# Patient Record
Sex: Male | Born: 1965 | Race: White | Hispanic: No | Marital: Married | Smoking: Former smoker
Health system: Southern US, Community
[De-identification: ages and names within clinical notes are randomized; demographics above are authoritative.]

## PROBLEM LIST (undated history)

## (undated) DIAGNOSIS — I10 Essential (primary) hypertension: Secondary | ICD-10-CM

## (undated) DIAGNOSIS — T7840XA Allergy, unspecified, initial encounter: Secondary | ICD-10-CM

## (undated) DIAGNOSIS — F1910 Other psychoactive substance abuse, uncomplicated: Secondary | ICD-10-CM

## (undated) DIAGNOSIS — E785 Hyperlipidemia, unspecified: Secondary | ICD-10-CM

## (undated) HISTORY — DX: Allergy, unspecified, initial encounter: T78.40XA

## (undated) HISTORY — DX: Other psychoactive substance abuse, uncomplicated: F19.10

## (undated) HISTORY — DX: Hyperlipidemia, unspecified: E78.5

## (undated) HISTORY — DX: Essential (primary) hypertension: I10

---

## 1998-08-23 HISTORY — PX: CERVICAL FUSION: SHX112

## 2015-01-13 ENCOUNTER — Telehealth: Payer: Self-pay | Admitting: Family Medicine

## 2015-01-14 NOTE — Telephone Encounter (Signed)
Stp he ntbs to establish care, pt doesn't have any insurance, he hasn't went to a doctor for a complete physical since the 80's when he was in the [employer]. Pt thinks he has gout , pt aware to arrive 30 minutes prior.

## 2015-01-14 NOTE — Telephone Encounter (Signed)
Pt given appt with Dr.Stacks 6/28 at 1:10.

## 2015-02-18 ENCOUNTER — Encounter: Payer: Self-pay | Admitting: Family Medicine

## 2015-02-18 ENCOUNTER — Ambulatory Visit: Payer: Self-pay | Admitting: Family Medicine

## 2015-02-18 ENCOUNTER — Encounter (INDEPENDENT_AMBULATORY_CARE_PROVIDER_SITE_OTHER): Payer: Self-pay

## 2015-02-18 VITALS — BP 146/92 | HR 76 | Temp 97.7°F | Ht 66.5 in | Wt 210.8 lb

## 2015-02-18 DIAGNOSIS — R6882 Decreased libido: Secondary | ICD-10-CM

## 2015-02-18 DIAGNOSIS — R072 Precordial pain: Secondary | ICD-10-CM

## 2015-02-18 DIAGNOSIS — M10079 Idiopathic gout, unspecified ankle and foot: Secondary | ICD-10-CM

## 2015-02-18 DIAGNOSIS — R12 Heartburn: Secondary | ICD-10-CM

## 2015-02-18 DIAGNOSIS — Z1212 Encounter for screening for malignant neoplasm of rectum: Secondary | ICD-10-CM

## 2015-02-18 LAB — POCT UA - MICROSCOPIC ONLY
Bacteria, U Microscopic: NEGATIVE
CRYSTALS, UR, HPF, POC: NEGATIVE
Casts, Ur, LPF, POC: NEGATIVE
Yeast, UA: NEGATIVE

## 2015-02-18 LAB — POCT URINALYSIS DIPSTICK
Bilirubin, UA: NEGATIVE
Glucose, UA: NEGATIVE
KETONES UA: NEGATIVE
Leukocytes, UA: NEGATIVE
NITRITE UA: NEGATIVE
SPEC GRAV UA: 1.01
Urobilinogen, UA: NEGATIVE
pH, UA: 6.5

## 2015-02-18 NOTE — Patient Instructions (Signed)
DASH Eating Plan DASH stands for "Dietary Approaches to Stop Hypertension." The DASH eating plan is a healthy eating plan that has been shown to reduce high blood pressure (hypertension). Additional health benefits may include reducing the risk of type 2 diabetes mellitus, heart disease, and stroke. The DASH eating plan may also help with weight loss. WHAT DO I NEED TO KNOW ABOUT THE DASH EATING PLAN? For the DASH eating plan, you will follow these general guidelines:  Choose foods with a percent daily value for sodium of less than 5% (as listed on the food label).  Use salt-free seasonings or herbs instead of table salt or sea salt.  Check with your health care provider or pharmacist before using salt substitutes.  Eat lower-sodium products, often labeled as "lower sodium" or "no salt added."  Eat fresh foods.  Eat more vegetables, fruits, and low-fat dairy products.  Choose whole grains. Look for the word "whole" as the first word in the ingredient list.  Choose fish and skinless chicken or turkey more often than red meat. Limit fish, poultry, and meat to 6 oz (170 g) each day.  Limit sweets, desserts, sugars, and sugary drinks.  Choose heart-healthy fats.  Limit cheese to 1 oz (28 g) per day.  Eat more home-cooked food and less restaurant, buffet, and fast food.  Limit fried foods.  Cook foods using methods other than frying.  Limit canned vegetables. If you do use them, rinse them well to decrease the sodium.  When eating at a restaurant, ask that your food be prepared with less salt, or no salt if possible. WHAT FOODS CAN I EAT? Seek help from a dietitian for individual calorie needs. Grains Whole grain or whole wheat bread. Brown rice. Whole grain or whole wheat pasta. Quinoa, bulgur, and whole grain cereals. Low-sodium cereals. Corn or whole wheat flour tortillas. Whole grain cornbread. Whole grain crackers. Low-sodium crackers. Vegetables Fresh or frozen vegetables  (raw, steamed, roasted, or grilled). Low-sodium or reduced-sodium tomato and vegetable juices. Low-sodium or reduced-sodium tomato sauce and paste. Low-sodium or reduced-sodium canned vegetables.  Fruits All fresh, canned (in natural juice), or frozen fruits. Meat and Other Protein Products Ground beef (85% or leaner), grass-fed beef, or beef trimmed of fat. Skinless chicken or turkey. Ground chicken or turkey. Pork trimmed of fat. All fish and seafood. Eggs. Dried beans, peas, or lentils. Unsalted nuts and seeds. Unsalted canned beans. Dairy Low-fat dairy products, such as skim or 1% milk, 2% or reduced-fat cheeses, low-fat ricotta or cottage cheese, or plain low-fat yogurt. Low-sodium or reduced-sodium cheeses. Fats and Oils Tub margarines without trans fats. Light or reduced-fat mayonnaise and salad dressings (reduced sodium). Avocado. Safflower, olive, or canola oils. Natural peanut or almond butter. Other Unsalted popcorn and pretzels. The items listed above may not be a complete list of recommended foods or beverages. Contact your dietitian for more options. WHAT FOODS ARE NOT RECOMMENDED? Grains White bread. White pasta. White rice. Refined cornbread. Bagels and croissants. Crackers that contain trans fat. Vegetables Creamed or fried vegetables. Vegetables in a cheese sauce. Regular canned vegetables. Regular canned tomato sauce and paste. Regular tomato and vegetable juices. Fruits Dried fruits. Canned fruit in light or heavy syrup. Fruit juice. Meat and Other Protein Products Fatty cuts of meat. Ribs, chicken wings, bacon, sausage, bologna, salami, chitterlings, fatback, hot dogs, bratwurst, and packaged luncheon meats. Salted nuts and seeds. Canned beans with salt. Dairy Whole or 2% milk, cream, half-and-half, and cream cheese. Whole-fat or sweetened yogurt. Full-fat   cheeses or blue cheese. Nondairy creamers and whipped toppings. Processed cheese, cheese spreads, or cheese  curds. Condiments Onion and garlic salt, seasoned salt, table salt, and sea salt. Canned and packaged gravies. Worcestershire sauce. Tartar sauce. Barbecue sauce. Teriyaki sauce. Soy sauce, including reduced sodium. Steak sauce. Fish sauce. Oyster sauce. Cocktail sauce. Horseradish. Ketchup and mustard. Meat flavorings and tenderizers. Bouillon cubes. Hot sauce. Tabasco sauce. Marinades. Taco seasonings. Relishes. Fats and Oils Butter, stick margarine, lard, shortening, ghee, and bacon fat. Coconut, palm kernel, or palm oils. Regular salad dressings. Other Pickles and olives. Salted popcorn and pretzels. The items listed above may not be a complete list of foods and beverages to avoid. Contact your dietitian for more information. WHERE CAN I FIND MORE INFORMATION? National Heart, [employer], and [employer] Institute: www.nhlbi.nih.gov/health/health-topics/topics/dash/ Document Released: 07/29/2011 Document Revised: 12/24/2013 Document Reviewed: 06/13/2013 ExitCare Patient Information 2015 [employer]. This information is not intended to replace advice given to you by your health care provider. Make sure you discuss any questions you have with your health care provider. Calorie Counting for Weight Loss Calories are energy you get from the things you eat and drink. Your body uses this energy to keep you going throughout the day. The number of calories you eat affects your weight. When you eat more calories than your body needs, your body stores the extra calories as fat. When you eat fewer calories than your body needs, your body burns fat to get the energy it needs. Calorie counting means keeping track of how many calories you eat and drink each day. If you make sure to eat fewer calories than your body needs, you should lose weight. In order for calorie counting to work, you will need to eat the number of calories that are right for you in a day to lose a healthy amount of weight per week. A healthy amount of  weight to lose per week is usually 1-2 lb (0.5-0.9 kg). A dietitian can determine how many calories you need in a day and give you suggestions on how to reach your calorie goal.  WHAT IS MY MY PLAN? My goal is to have __________ calories per day.  If I have this many calories per day, I should lose around __________ pounds per week. WHAT DO I NEED TO KNOW ABOUT CALORIE COUNTING? In order to meet your daily calorie goal, you will need to:  Find out how many calories are in each food you would like to eat. Try to do this before you eat.  Decide how much of the food you can eat.  Write down what you ate and how many calories it had. Doing this is called keeping a food log. WHERE DO I FIND CALORIE INFORMATION? The number of calories in a food can be found on a Nutrition Facts label. Note that all the information on a label is based on a specific serving of the food. If a food does not have a Nutrition Facts label, try to look up the calories online or ask your dietitian for help. HOW DO I DECIDE HOW MUCH TO EAT? To decide how much of the food you can eat, you will need to consider both the number of calories in one serving and the size of one serving. This information can be found on the Nutrition Facts label. If a food does not have a Nutrition Facts label, look up the information online or ask your dietitian for help. Remember that calories are listed per serving. If you   choose to have more than one serving of a food, you will have to multiply the calories per serving by the amount of servings you plan to eat. For example, the label on a package of bread might say that a serving size is 1 slice and that there are 90 calories in a serving. If you eat 1 slice, you will have eaten 90 calories. If you eat 2 slices, you will have eaten 180 calories. HOW DO I KEEP A FOOD LOG? After each meal, record the following information in your food log:  What you ate.  How much of it you ate.  How many calories  it had.  Then, add up your calories. Keep your food log near you, such as in a small notebook in your pocket. Another option is to use a mobile app or website. Some programs will calculate calories for you and show you how many calories you have left each time you add an item to the log. WHAT ARE SOME CALORIE COUNTING TIPS?  Use your calories on foods and drinks that will fill you up and not leave you hungry. Some examples of this include foods like nuts and nut butters, vegetables, lean proteins, and high-fiber foods (more than 5 g fiber per serving).  Eat nutritious foods and avoid empty calories. Empty calories are calories you get from foods or beverages that do not have many nutrients, such as candy and soda. It is better to have a nutritious high-calorie food (such as an avocado) than a food with few nutrients (such as a bag of chips).  Know how many calories are in the foods you eat most often. This way, you do not have to look up how many calories they have each time you eat them.  Look out for foods that may seem like low-calorie foods but are really high-calorie foods, such as baked goods, soda, and fat-free candy.  Pay attention to calories in drinks. Drinks such as sodas, specialty coffee drinks, alcohol, and juices have a lot of calories yet do not fill you up. Choose low-calorie drinks like water and diet drinks.  Focus your calorie counting efforts on higher calorie items. Logging the calories in a garden salad that contains only vegetables is less important than calculating the calories in a milk shake.  Find a way of tracking calories that works for you. Get creative. Most people who are successful find ways to keep track of how much they eat in a day, even if they do not count every calorie. WHAT ARE SOME PORTION CONTROL TIPS?  Know how many calories are in a serving. This will help you know how many servings of a certain food you can have.  Use a measuring cup to measure  serving sizes. This is helpful when you start out. With time, you will be able to estimate serving sizes for some foods.  Take some time to put servings of different foods on your favorite plates, bowls, and cups so you know what a serving looks like.  Try not to eat straight from a bag or box. Doing this can lead to overeating. Put the amount you would like to eat in a cup or on a plate to make sure you are eating the right portion.  Use smaller plates, glasses, and bowls to prevent overeating. This is a quick and easy way to practice portion control. If your plate is smaller, less food can fit on it.  Try not to multitask while eating, such   as watching TV or using your computer. If it is time to eat, sit down at a table and enjoy your food. Doing this will help you to start recognizing when you are full. It will also make you more aware of what and how much you are eating. HOW CAN I CALORIE COUNT WHEN EATING OUT?  Ask for smaller portion sizes or child-sized portions.  Consider sharing an entree and sides instead of getting your own entree.  If you get your own entree, eat only half. Ask for a box at the beginning of your meal and put the rest of your entree in it so you are not tempted to eat it.  Look for the calories on the menu. If calories are listed, choose the lower calorie options.  Choose dishes that include vegetables, fruits, whole grains, low-fat dairy products, and lean protein. Focusing on smart food choices from each of the 5 food groups can help you stay on track at restaurants.  Choose items that are boiled, broiled, grilled, or steamed.  Choose water, milk, unsweetened iced tea, or other drinks without added sugars. If you want an alcoholic beverage, choose a lower calorie option. For example, a regular margarita can have up to 700 calories and a glass of wine has around 150.  Stay away from items that are buttered, battered, fried, or served with cream sauce. Items  labeled "crispy" are usually fried, unless stated otherwise.  Ask for dressings, sauces, and syrups on the side. These are usually very high in calories, so do not eat much of them.  Watch out for salads. Many people think salads are a healthy option, but this is often not the case. Many salads come with bacon, fried chicken, lots of cheese, fried chips, and dressing. All of these items have a lot of calories. If you want a salad, choose a garden salad and ask for grilled meats or steak. Ask for the dressing on the side, or ask for olive oil and vinegar or lemon to use as dressing.  Estimate how many servings of a food you are given. For example, a serving of cooked rice is  cup or about the size of half a tennis ball or one cupcake wrapper. Knowing serving sizes will help you be aware of how much food you are eating at restaurants. The list below tells you how big or small some common portion sizes are based on everyday objects.  1 oz--4 stacked dice.  3 oz--1 deck of cards.  1 tsp--1 dice.  1 Tbsp-- a Ping-Pong ball.  2 Tbsp--1 Ping-Pong ball.   cup--1 tennis ball or 1 cupcake wrapper.  1 cup--1 baseball. Document Released: 08/09/2005 Document Revised: 12/24/2013 Document Reviewed: 06/14/2013 ExitCare Patient Information 2015 [employer]. This information is not intended to replace advice given to you by your health care provider. Make sure you discuss any questions you have with your health care provider.  

## 2015-02-18 NOTE — Progress Notes (Signed)
Subjective:  Patient ID: Lawrence Sharp, male    DOB: 29-Aug-1965  Age: 49 y.o. MRN: [id number]  CC: Establish Care   HPI LUI BELLIS presents for BP runs 139/70 checked daily at work. Not sure why it was so high here today. Patient has no insurance and wants to review his risks for heart disease and stroke due to a friend having a heart attack 2-3 weeks ago. Patient realizes he needs to exercise. He has not exercised in over 10 years. He admits to not eating a balanced or healthy diet. He is not interested in taking medications. He asks for the possibility for natural treatments for cholesterol etc.  Patient would like to have Viagra for his loss of libido. He is curious about his testosterone level. However, he states that he cannot afford the blood test. 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 These can be rather heavy to lift and move along with the equipment he uses for the job.   HE experiences heartburn with spicy foods. It is moderate and may last several hours. He has been belching frequently.  History Jamauri has a past medical history of shellfish; Hypertension; Hyperlipidemia; and Substance abuse.   He has past surgical history that includes Cervical fusion (2000).   His family history includes Heart disease in his father; Hypertension in his father. There is no history of Asthma, Cancer, COPD, Early death, or Mental illness.He reports that he has quit smoking. He does not have any smokeless tobacco history on file. He reports that he drinks alcohol. He reports that he uses illicit drugs (Marijuana).  No current outpatient prescriptions on file prior to visit.   No current facility-administered medications on file prior to visit.    ROS Review of Systems  Constitutional: Negative for fever, chills, diaphoresis, activity change, appetite change, fatigue and unexpected weight change.  HENT: Negative for congestion, ear pain, hearing loss, postnasal drip,  rhinorrhea, sore throat, tinnitus and trouble swallowing.   Eyes: Negative for photophobia, pain, discharge and redness.  Respiratory: Positive for chest tightness. Negative for apnea, cough, choking, shortness of breath, wheezing and stridor.   Cardiovascular: Positive for chest pain. Negative for palpitations and leg swelling.  Gastrointestinal: Negative for nausea, vomiting, abdominal pain, diarrhea, constipation, blood in stool and abdominal distention.  Endocrine: Negative for cold intolerance, heat intolerance, polydipsia, polyphagia and polyuria.  Genitourinary: Negative for dysuria, urgency, frequency, hematuria, flank pain, enuresis, difficulty urinating and genital sores.  Musculoskeletal: Negative for joint swelling and arthralgias.  Skin: Negative for color change, rash and wound.  Allergic/Immunologic: Negative for immunocompromised state.  Neurological: Negative for dizziness, tremors, seizures, syncope, facial asymmetry, speech difficulty, weakness, light-headedness, numbness and headaches.  Hematological: Does not bruise/bleed easily.  Psychiatric/Behavioral: Negative for suicidal ideas, hallucinations, behavioral problems, confusion, sleep disturbance, dysphoric mood, decreased concentration and agitation. The patient is not nervous/anxious and is not hyperactive.     Objective:  BP 146/92 mmHg  Pulse 76  Temp(Src) 97.7 F (36.5 C) (Oral)  Ht 5' 6.5" (1.689 m)  Wt 210 lb 12.8 oz (95.618 kg)  BMI 33.52 kg/m2  Physical Exam  Constitutional: He is oriented to person, place, and time. He appears well-developed and well-nourished. No distress.  HENT:  Head: Normocephalic and atraumatic.  Right Ear: External ear normal.  Left Ear: External ear normal.  Nose: Nose normal.  Mouth/Throat: Oropharynx is clear and moist.  Eyes: Conjunctivae and EOM are normal. Pupils are equal, round, and  reactive to light.  Neck: Normal range of motion. Neck supple. No thyromegaly present.    Cardiovascular: Normal rate, regular rhythm and normal heart sounds.   No murmur heard. Pulmonary/Chest: Effort normal and breath sounds normal. No respiratory distress. He has no wheezes. He has no rales.  Abdominal: Soft. Bowel sounds are normal. He exhibits no distension. There is no tenderness.  Lymphadenopathy:    He has no cervical adenopathy.  Neurological: He is alert and oriented to person, place, and time. He has normal reflexes.  Skin: Skin is warm and dry.  Psychiatric: He has a normal mood and affect. His behavior is normal. Judgment and thought content normal.    Assessment & Plan:   Bertis was seen today for establish care.  Diagnoses and all orders for this visit:  Precordial pain Orders: -     POCT CBC -     CMP14+EGFR -     Lipid panel -     PSA, total and free -     POCT UA - Microscopic Only -     POCT urinalysis dipstick  Heartburn Orders: -     POCT CBC -     CMP14+EGFR -     Lipid panel -     PSA, total and free -     POCT UA - Microscopic Only -     POCT urinalysis dipstick  Loss of libido Orders: -     POCT CBC -     CMP14+EGFR -     Lipid panel -     PSA, total and free -     POCT UA - Microscopic Only -     POCT urinalysis dipstick  Idiopathic gout of foot, unspecified chronicity, unspecified laterality Orders: -     POCT CBC -     CMP14+EGFR -     Lipid panel -     PSA, total and free -     POCT UA - Microscopic Only -     POCT urinalysis dipstick  Screening for malignant neoplasm of the rectum Orders: -     Fecal occult blood, imunochemical   Mr. Dunlevy does not currently have medications on file.  No orders of the defined types were placed in this encounter.   Discussed Krill oil for cholesterol. DASH plan for BP. Calorie reduction handout given for recommended weight loss of 40 lb. Pt. Declines stress test at this time.  Follow-up: Return in about 3 months (around 05/21/2015), or if symptoms worsen or fail to  improve.  Claretta Fraise, M.D.

## 2015-02-20 LAB — FECAL OCCULT BLOOD, IMMUNOCHEMICAL: FECAL OCCULT BLD: NEGATIVE

## 2015-02-22 ENCOUNTER — Other Ambulatory Visit: Payer: Self-pay | Admitting: Nurse Practitioner

## 2015-02-22 ENCOUNTER — Other Ambulatory Visit (INDEPENDENT_AMBULATORY_CARE_PROVIDER_SITE_OTHER): Payer: Self-pay

## 2015-02-22 DIAGNOSIS — Z0000 Encounter for general adult medical examination without abnormal findings: Secondary | ICD-10-CM

## 2015-02-22 LAB — POCT CBC
GRANULOCYTE PERCENT: 68.8 % (ref 37–80)
HCT, POC: 42.8 % — AB (ref 43.5–53.7)
Hemoglobin: 13.5 g/dL — AB (ref 14.1–18.1)
LYMPH, POC: 2 (ref 0.6–3.4)
MCH: 27.4 pg (ref 27–31.2)
MCHC: 31.6 g/dL — AB (ref 31.8–35.4)
MCV: 86.6 fL (ref 80–97)
MPV: 7.4 fL (ref 0–99.8)
PLATELET COUNT, POC: 278 10*3/uL (ref 142–424)
POC Granulocyte: 5.2 (ref 2–6.9)
POC LYMPH %: 26.1 % (ref 10–50)
RBC: 4.95 M/uL (ref 4.69–6.13)
RDW, POC: 13.9 %
WBC: 7.6 10*3/uL (ref 4.6–10.2)

## 2015-02-24 LAB — CMP14+EGFR
A/G RATIO: 1.7 (ref 1.1–2.5)
ALBUMIN: 4.3 g/dL (ref 3.5–5.5)
ALT: 23 IU/L (ref 0–44)
AST: 16 IU/L (ref 0–40)
Alkaline Phosphatase: 41 IU/L (ref 39–117)
BUN/Creatinine Ratio: 20 (ref 9–20)
BUN: 16 mg/dL (ref 6–24)
Bilirubin Total: 0.5 mg/dL (ref 0.0–1.2)
CO2: 22 mmol/L (ref 18–29)
Calcium: 9.1 mg/dL (ref 8.7–10.2)
Chloride: 97 mmol/L (ref 97–108)
Creatinine, Ser: 0.82 mg/dL (ref 0.76–1.27)
GFR calc Af Amer: 119 mL/min/{1.73_m2} (ref >59)
GFR, EST NON AFRICAN AMERICAN: 103 mL/min/{1.73_m2} (ref >59)
GLOBULIN, TOTAL: 2.6 g/dL (ref 1.5–4.5)
Glucose: 106 mg/dL — ABNORMAL HIGH (ref 65–99)
Potassium: 4.3 mmol/L (ref 3.5–5.2)
Sodium: 140 mmol/L (ref 134–144)
Total Protein: 6.9 g/dL (ref 6.0–8.5)

## 2015-02-24 LAB — PSA TOTAL+% FREE (SERIAL)
PSA FREE: 0.19 ng/mL
PSA, Free Pct: 38 %
Prostate Specific Ag, Serum: 0.5 ng/mL (ref 0.0–4.0)

## 2015-02-24 LAB — LIPID PANEL
Chol/HDL Ratio: 5 ratio units (ref 0.0–5.0)
Cholesterol, Total: 234 mg/dL — ABNORMAL HIGH (ref 100–199)
HDL: 47 mg/dL (ref >39)
LDL Calculated: 145 mg/dL — ABNORMAL HIGH (ref 0–99)
Triglycerides: 212 mg/dL — ABNORMAL HIGH (ref 0–149)
VLDL CHOLESTEROL CAL: 42 mg/dL — AB (ref 5–40)

## 2016-04-21 ENCOUNTER — Emergency Department (HOSPITAL_COMMUNITY)
Admission: EM | Admit: 2016-04-21 | Discharge: 2016-04-21 | Disposition: A | Discharge status: Home / Self Care | Payer: Self-pay | Attending: Emergency Medicine | Admitting: Emergency Medicine

## 2016-04-21 ENCOUNTER — Other Ambulatory Visit: Payer: Self-pay

## 2016-04-21 ENCOUNTER — Encounter (HOSPITAL_COMMUNITY): Payer: Self-pay | Admitting: Emergency Medicine

## 2016-04-21 ENCOUNTER — Emergency Department (HOSPITAL_COMMUNITY): Payer: Self-pay

## 2016-04-21 DIAGNOSIS — W010XXA Fall on same level from slipping, tripping and stumbling without subsequent striking against object, initial encounter: Secondary | ICD-10-CM | POA: Insufficient documentation

## 2016-04-21 DIAGNOSIS — Y939 Activity, unspecified: Secondary | ICD-10-CM | POA: Insufficient documentation

## 2016-04-21 DIAGNOSIS — I10 Essential (primary) hypertension: Secondary | ICD-10-CM | POA: Insufficient documentation

## 2016-04-21 DIAGNOSIS — R55 Syncope and collapse: Secondary | ICD-10-CM

## 2016-04-21 DIAGNOSIS — Z87891 Personal history of nicotine dependence: Secondary | ICD-10-CM | POA: Insufficient documentation

## 2016-04-21 DIAGNOSIS — S060X9A Concussion with loss of consciousness of unspecified duration, initial encounter: Secondary | ICD-10-CM | POA: Insufficient documentation

## 2016-04-21 DIAGNOSIS — Y92002 Bathroom of unspecified non-institutional (private) residence single-family (private) house as the place of occurrence of the external cause: Secondary | ICD-10-CM | POA: Insufficient documentation

## 2016-04-21 DIAGNOSIS — Y999 Unspecified external cause status: Secondary | ICD-10-CM | POA: Insufficient documentation

## 2016-04-21 LAB — CBC WITH DIFFERENTIAL/PLATELET
BASOS PCT: 0 %
Basophils Absolute: 0 10*3/uL (ref 0.0–0.1)
Eosinophils Absolute: 0.1 10*3/uL (ref 0.0–0.7)
Eosinophils Relative: 1 %
HCT: 42.7 % (ref 39.0–52.0)
Hemoglobin: 13.5 g/dL (ref 13.0–17.0)
LYMPHS PCT: 18 %
Lymphs Abs: 1.5 10*3/uL (ref 0.7–4.0)
MCH: 28.8 pg (ref 26.0–34.0)
MCHC: 31.6 g/dL (ref 30.0–36.0)
MCV: 91.2 fL (ref 78.0–100.0)
Monocytes Absolute: 0.3 10*3/uL (ref 0.1–1.0)
Monocytes Relative: 4 %
NEUTROS ABS: 6.1 10*3/uL (ref 1.7–7.7)
Neutrophils Relative %: 77 %
Platelets: 283 10*3/uL (ref 150–400)
RBC: 4.68 MIL/uL (ref 4.22–5.81)
RDW: 14.1 % (ref 11.5–15.5)
WBC: 8 10*3/uL (ref 4.0–10.5)

## 2016-04-21 LAB — BASIC METABOLIC PANEL
ANION GAP: 10 (ref 5–15)
BUN: 13 mg/dL (ref 6–20)
CALCIUM: 9 mg/dL (ref 8.9–10.3)
CO2: 26 mmol/L (ref 22–32)
Chloride: 102 mmol/L (ref 101–111)
Creatinine, Ser: 0.77 mg/dL (ref 0.61–1.24)
GFR calc non Af Amer: >60 mL/min (ref >60)
Glucose, Bld: 146 mg/dL — ABNORMAL HIGH (ref 65–99)
POTASSIUM: 5.2 mmol/L — AB (ref 3.5–5.1)
Sodium: 138 mmol/L (ref 135–145)

## 2016-04-21 LAB — TROPONIN I: Troponin I: <0.03 ng/mL (ref <0.03)

## 2016-04-21 NOTE — ED Triage Notes (Signed)
Pt presents to ER from home with [facility] for injuries from a fall in the bathroom; pt cannot recall what caused the fall whether it be syncope or mechanical trip and fall; family reports damage to toilet in bathroom; hematoma noted to posterior head; EMS reports that patient was disoriented and "dazed", not recognizing family, on scene and slow to respond to questions and reflexes were slowed however answered question appropriately; EMS also states that pt was c/o nausea/vomiting x 2 and was given 8mg  zofran; pt then became more alert and oriented; pt also reports drinking ETOH tonight

## 2016-04-21 NOTE — ED Notes (Signed)
Pt assisted to the restroom; pt reported feeling dizziness and nauseated; pt proceeded to the restroom to relieve himself, placed in a wheelchair and brought back to room where he was then placed back on cardiac monitor; Pollina MD notified

## 2016-04-21 NOTE — ED Notes (Signed)
Pt returned from CT; will continue to monitor 

## 2016-04-21 NOTE — ED Provider Notes (Signed)
[facility]-EMERGENCY DEPT Provider Note   CSN: [id number] Arrival date & time: 04/21/16  0213  By signing my name below, I, Emmanuella Mensah, attest that this documentation has been prepared under the direction and in the presence of Gilda Crease, MD. Electronically Signed: Angelene Giovanni, ED Scribe. 04/21/16. 2:45 AM.    History   Chief Complaint Chief Complaint  Patient presents with  . Fall  . Head Injury   HPI Comments: Lawrence Sharp is a 50 y.o. male with a hx of hypertension and HLD who presents to the Emergency Department complaining of a hematoma to his left posterior scalp s/p unwitnessed fall that occurred PTA. He explains that he does not remember the mechanism of the fall and is unsure of any LOC. Pt's wife adds that pt was dry when she found him laying on the floor after hearing him fall. She states that pt was confused, disoriented, and unable to recognize family after the fall. No alleviating factors noted. Pt has not tried any medications PTA. Per EMS, pt endorsed ETOH use prior to the fall and was complaining of nausea and 2 episodes of non-bloody vomiting. No fever, chills, generalized rash, or any open wounds.    The history is provided by the patient. No language interpreter was used.  Fall  This is a new problem. The problem has been resolved. Associated symptoms include headaches. Nothing aggravates the symptoms. Nothing relieves the symptoms. He has tried nothing for the symptoms.  Head Injury   He came to the ER via EMS. The injury mechanism was a fall. There was no blood loss. The pain is moderate. The pain has been constant since the injury. Associated symptoms include vomiting. He has tried nothing for the symptoms.    Past Medical History:  Diagnosis Date  . Hyperlipidemia   . Hypertension   . shellfish   . Substance abuse     There are no active problems to display for this patient.   Past Surgical History:  Procedure Laterality Date  .  CERVICAL FUSION  2000   C4-C5 [place]       Home Medications    Prior to Admission medications   Not on File    Family History Family History  Problem Relation Age of Onset  . Heart disease Father   . Hypertension Father   . Asthma Neg Hx   . Cancer Neg Hx   . COPD Neg Hx   . Early death Neg Hx   . Mental illness Neg Hx     Social History Social History  Substance Use Topics  . Smoking status: Former Games developer  . Smokeless tobacco: Never Used  . Alcohol use Yes     Comment: occasional wine     Allergies   Lobster [shellfish allergy]   Review of Systems Review of Systems  Constitutional: Negative for chills and fever.  Gastrointestinal: Positive for nausea and vomiting.  Skin: Positive for wound. Negative for rash.  Neurological: Positive for headaches.  All other systems reviewed and are negative.    Physical Exam Updated Vital Signs BP 139/94   Pulse 76   Temp 98 F (36.7 C) (Oral)   Resp 10   Ht 5\' 8"  (1.727 m)   Wt 215 lb (97.5 kg)   SpO2 98%   BMI 32.69 kg/m   Physical Exam  Constitutional: He is oriented to person, place, and time. He appears well-developed and well-nourished. No distress.  HENT:  Head: Normocephalic and atraumatic.  Right Ear: Hearing normal.  Left Ear: Hearing normal.  Nose: Nose normal.  Mouth/Throat: Oropharynx is clear and moist and mucous membranes are normal.  Eyes: Conjunctivae and EOM are normal. Pupils are equal, round, and reactive to light.  Neck: Normal range of motion. Neck supple.  Cardiovascular: Regular rhythm, S1 normal and S2 normal.  Exam reveals no gallop and no friction rub.   No murmur heard. Pulmonary/Chest: Effort normal and breath sounds normal. No respiratory distress. He exhibits no tenderness.  Abdominal: Soft. Normal appearance and bowel sounds are normal. There is no hepatosplenomegaly. There is no tenderness. There is no rebound, no guarding, no tenderness at McBurney's point and negative  Murphy's sign. No hernia.  Musculoskeletal: Normal range of motion.  Neurological: He is alert and oriented to person, place, and time. He has normal strength. No cranial nerve deficit or sensory deficit. Coordination normal. GCS eye subscore is 4. GCS verbal subscore is 5. GCS motor subscore is 6.  Skin: Skin is warm, dry and intact. No rash noted. No cyanosis.  Left occipital hematoma   Psychiatric: He has a normal mood and affect. His speech is normal and behavior is normal. Thought content normal.  Nursing note and vitals reviewed.    ED Treatments / Results  DIAGNOSTIC STUDIES: Oxygen Saturation is 95% on RA, normal by my interpretation.    COORDINATION OF CARE: 2:39 AM- Pt advised of plan for treatment and pt agrees. He will receive lab work and CT head scan for further evaluation.    Labs (all labs ordered are listed, but only abnormal results are displayed) Labs Reviewed  BASIC METABOLIC PANEL - Abnormal; Notable for the following:       Result Value   Potassium 5.2 (*)    Glucose, Bld 146 (*)    All other components within normal limits  CBC WITH DIFFERENTIAL/PLATELET  TROPONIN I    EKG  EKG Interpretation None       Radiology Ct Head Wo Contrast  Result Date: 04/21/2016 CLINICAL DATA:  Fall in bathroom.  Posterior scalp hematoma. EXAM: CT HEAD WITHOUT CONTRAST TECHNIQUE: Contiguous axial images were obtained from the base of the skull through the vertex without intravenous contrast. COMPARISON:  None. FINDINGS: Brain: There is no mass lesion, intraparenchymal hemorrhage or extra-axial collection. No evidence of acute cortical infarct. No hydrocephalus. Brain parenchyma and CSF-containing spaces are normal for age. Vascular: No hyperdense vessel or abnormal vascular calcification Skull: Normal Sinuses/Orbits: Paranasal sinuses and mastoids are clear. Normal appearance of the orbits. Other: Small left parietal scalp hematoma. IMPRESSION: 1. No acute intracranial  abnormality. 2. Small left parietal scalp hematoma. Electronically Signed   By: Deatra RobinsonKevin  Herman M.D.   On: 04/21/2016 05:47    Procedures Procedures (including critical care time)  Medications Ordered in ED Medications - No data to display   Initial Impression / Assessment and Plan / ED Course  Gilda Creasehristopher J Yadira Hada, MD has reviewed the triage vital signs and the nursing notes.  Pertinent labs & imaging results that were available during my care of the patient were reviewed by me and considered in my medical decision making (see chart for details).  Clinical Course    Patient presents for evaluation of head injury. Patient reportedly fell in his home prior to arrival. Circumstances are unclear because he does not remember falling. He had been in the bathroom taking a shower just prior to the event. Wife heard him fall and found him on the floor in the bathroom.  He was awake but confused initially. Confusion and repetitive questioning has slowly cleared here in the ER. He did not have any focal neurologic findings on examination. Head CT was negative.  It's not clear if he simply slipped and fell or had a syncopal episode, however. Cardiac evaluation, lab work are unremarkable. Patient is felt to be low risk for discharge after syncope based on [place] syncope rule:  [facility] Syncope Rule from [email or web address]  on 04/21/2016 ** All calculations should be rechecked by clinician prior to use **  RESULT SUMMARY:     Patient IS in the low-risk group for serious outcome.   INPUTS: Congestive heart failure history -> 0 = No Hematocrit <30% -> 0 = No EKG abnormal (EKG changed, or any non-sinus rhythm on EKG or monitoring) -> 0 = No Shortness of breath symptoms -> 0 = No Systolic BP <90 mmHg at triage -> 0 = No  Patient will be discharge, rest and follow-up with primary doctor.  Final Clinical Impressions(s) / ED Diagnoses   Final diagnoses:  Concussion, with loss of consciousness  of unspecified duration, initial encounter  Syncope, unspecified syncope type    New Prescriptions New Prescriptions   No medications on file   I personally performed the services described in this documentation, which was scribed in my presence. The recorded information has been reviewed and is accurate.    Gilda Crease, MD 04/21/16 [phone]

## 2016-04-21 NOTE — ED Notes (Signed)
Patient transported to CT 

## 2017-09-23 IMAGING — CT CT HEAD W/O CM
4 series · 16 of 47 positions shown, 18 images · non-contrast
Comparison: None.

CLINICAL DATA: Fall in bathroom.  Posterior scalp hematoma.

EXAM:
CT HEAD WITHOUT CONTRAST
TECHNIQUE: Contiguous axial images were obtained from the base of the skull
through the vertex without intravenous contrast.

[Series 2: head without · axial · non-contrast · 0.42mm/px · z∈[-153,-33]mm · 7 of 32 slices shown, 9 images]
[im 4/32  brain]
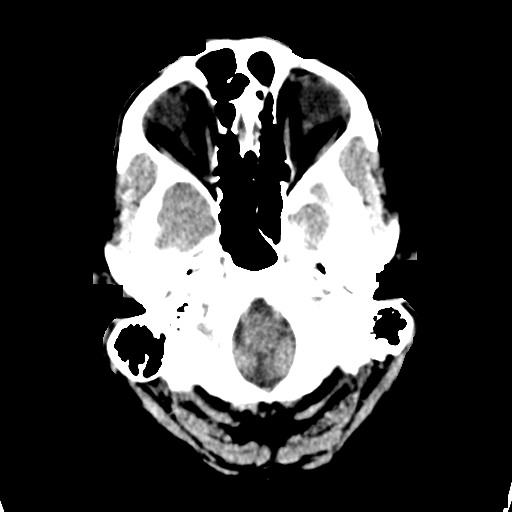
[im 4/32  bone]
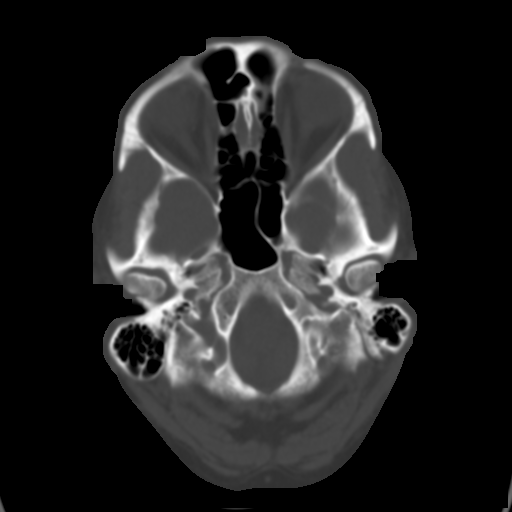
[im 8/32  brain]
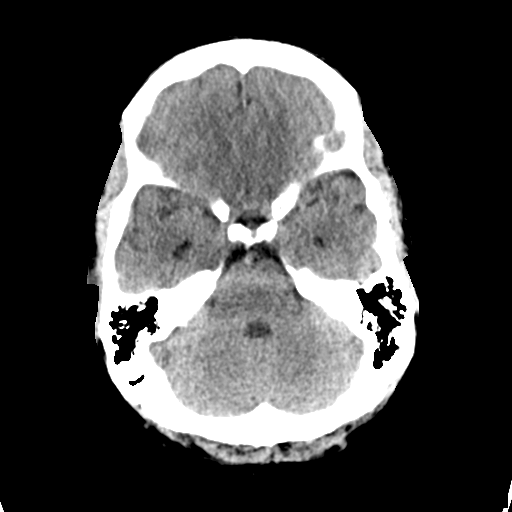
[im 12/32  brain]
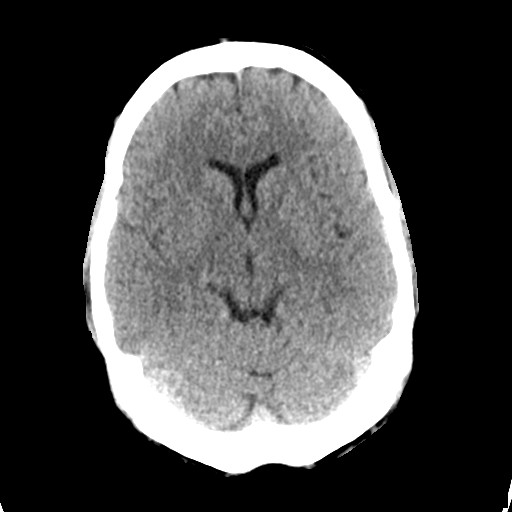
[im 16/32  brain]
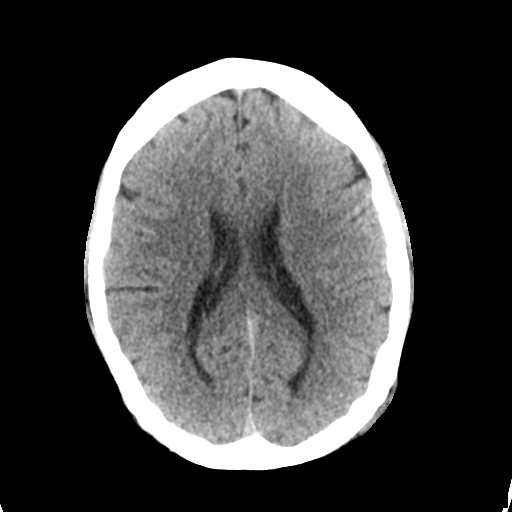
[im 20/32  brain]
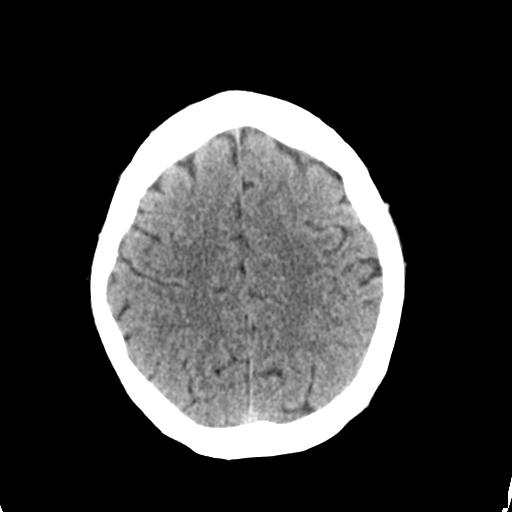
[im 20/32  bone]
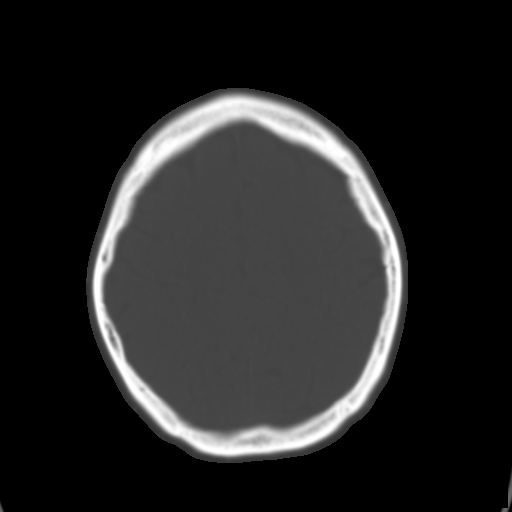
[im 24/32  brain]
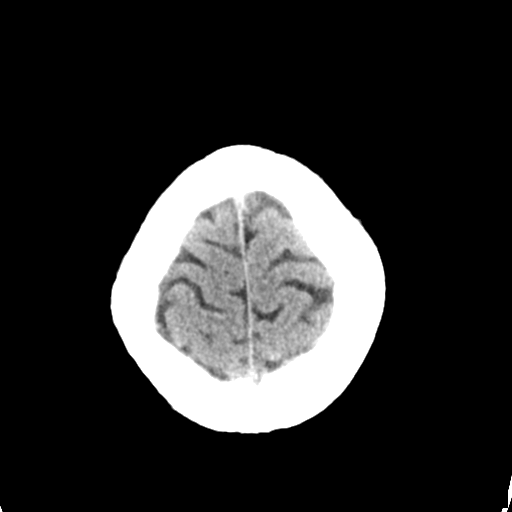
[im 28/32  brain]
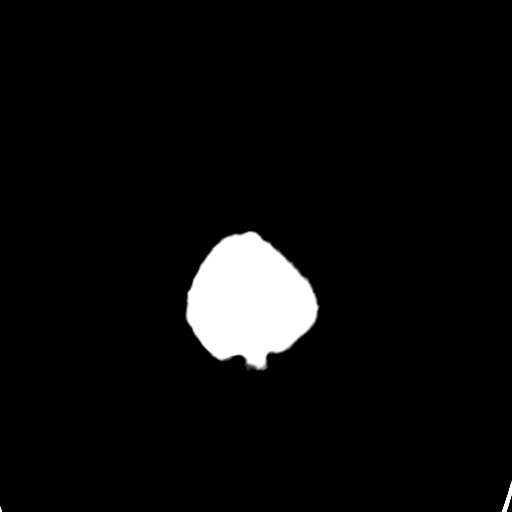

[Series 3: head bone · axial · 0.42mm/px · z∈[-154,-122]mm · 3 of 78 slices shown]
[im 8/78  bone]
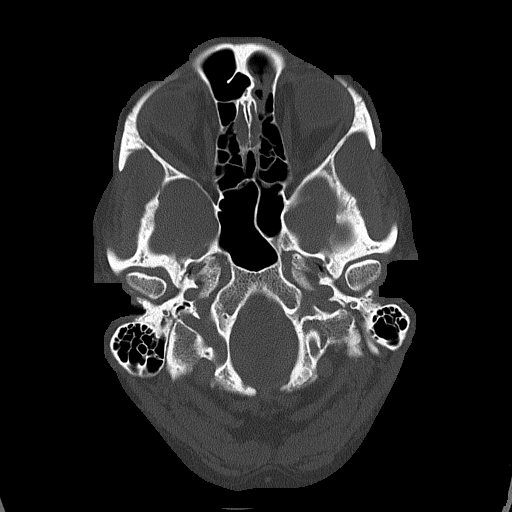
[im 16/78  bone]
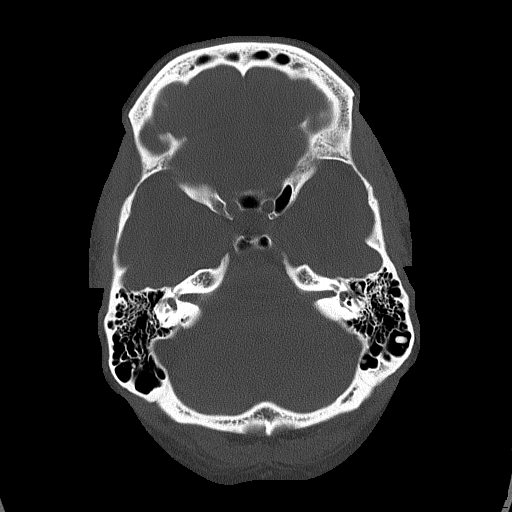
[im 24/78  bone]
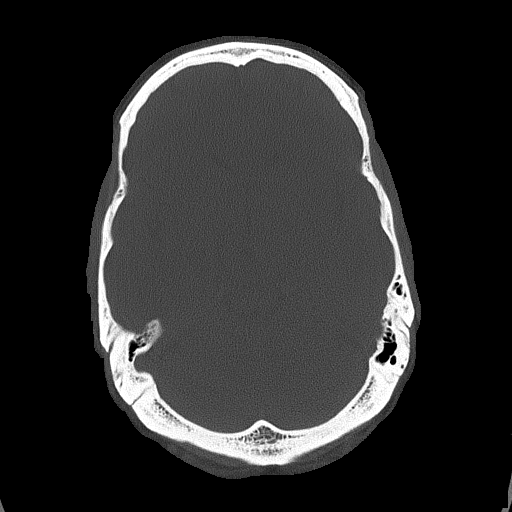

[Series 4: head without cor · coronal · non-contrast · 0.31mm/px · 3 of 67 slices shown]
[im 23/67  brain]
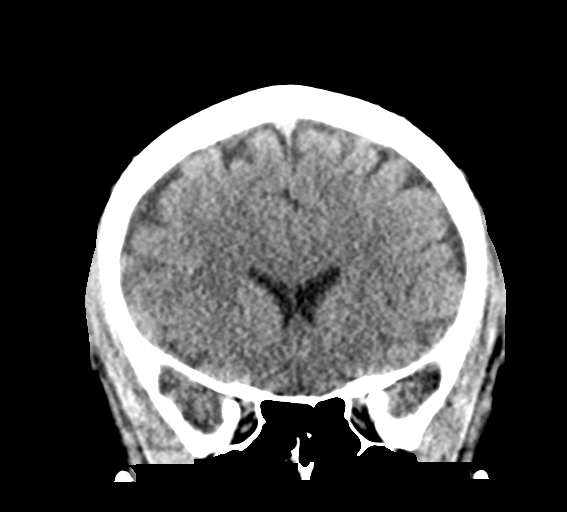
[im 30/67  brain]
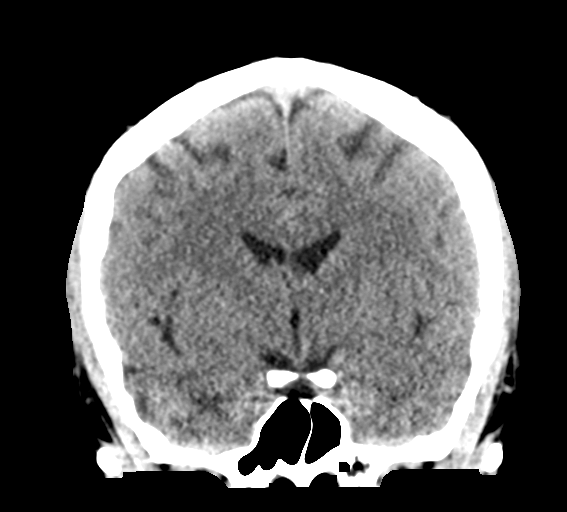
[im 37/67  brain]
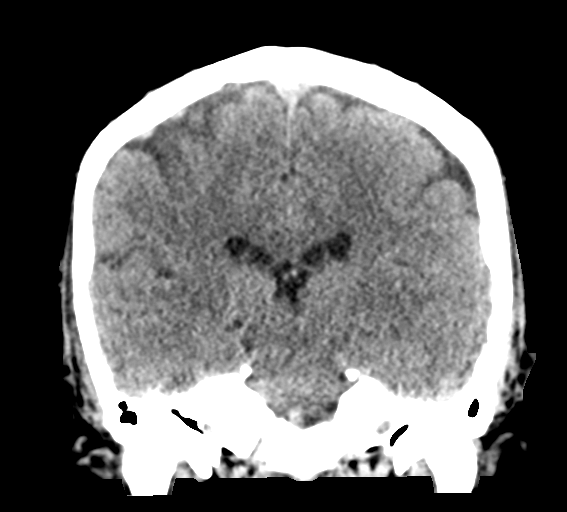

[Series 5: head without sag · sagittal · non-contrast · 0.31mm/px · 3 of 59 slices shown]
[im 20/59  brain]
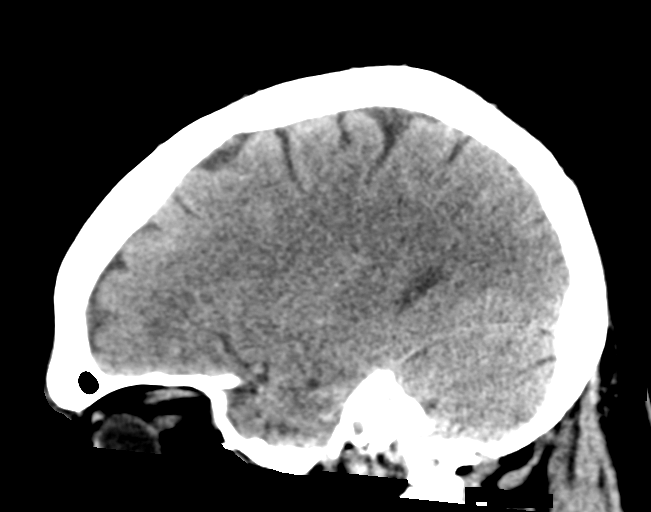
[im 30/59  brain]
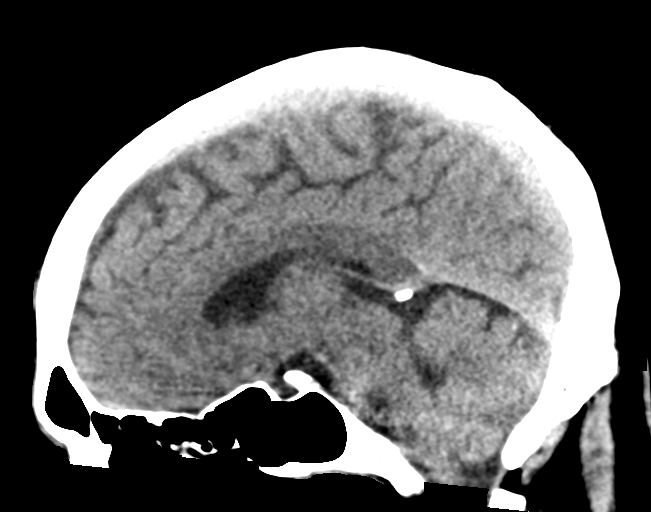
[im 39/59  brain]
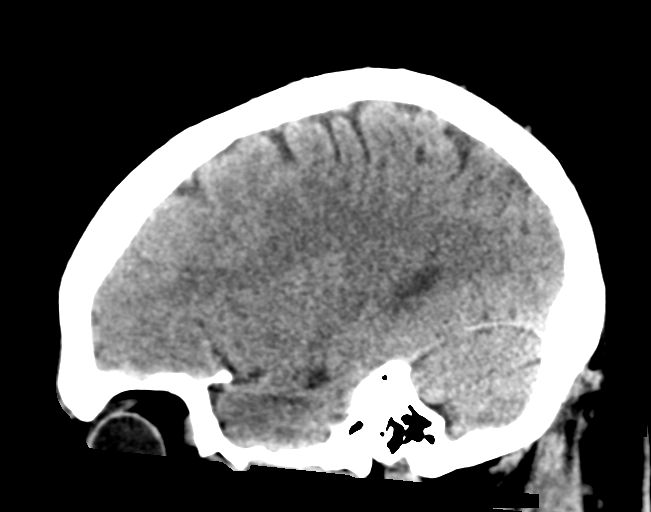

[16 of 47 positions shown; findings below may reference images not displayed]

FINDINGS: Brain: There is no mass lesion, intraparenchymal hemorrhage or
extra-axial collection. No evidence of acute cortical infarct. No
hydrocephalus. Brain parenchyma and CSF-containing spaces are normal
for age.

Vascular: No hyperdense vessel or abnormal vascular calcification

Skull: Normal

Sinuses/Orbits: Paranasal sinuses and mastoids are clear. Normal
appearance of the orbits.

Other: Small left parietal scalp hematoma.
IMPRESSION: 1. No acute intracranial abnormality.
2. Small left parietal scalp hematoma.

## 2022-04-02 ENCOUNTER — Other Ambulatory Visit: Payer: Self-pay

## 2022-04-02 ENCOUNTER — Emergency Department (HOSPITAL_COMMUNITY): Admission: EM | Admit: 2022-04-02 | Discharge: 2022-04-02 | Discharge status: Against Medical Advice | Payer: Self-pay

## 2022-04-02 ENCOUNTER — Emergency Department (HOSPITAL_COMMUNITY)
Admission: EM | Admit: 2022-04-02 | Discharge: 2022-04-02 | Disposition: A | Discharge status: Home / Self Care | Payer: Self-pay | Attending: Emergency Medicine | Admitting: Emergency Medicine

## 2022-04-02 ENCOUNTER — Emergency Department (HOSPITAL_COMMUNITY): Payer: Self-pay

## 2022-04-02 DIAGNOSIS — N200 Calculus of kidney: Secondary | ICD-10-CM

## 2022-04-02 DIAGNOSIS — I10 Essential (primary) hypertension: Secondary | ICD-10-CM | POA: Insufficient documentation

## 2022-04-02 DIAGNOSIS — R739 Hyperglycemia, unspecified: Secondary | ICD-10-CM | POA: Insufficient documentation

## 2022-04-02 DIAGNOSIS — N132 Hydronephrosis with renal and ureteral calculous obstruction: Secondary | ICD-10-CM | POA: Insufficient documentation

## 2022-04-02 DIAGNOSIS — D72829 Elevated white blood cell count, unspecified: Secondary | ICD-10-CM | POA: Insufficient documentation

## 2022-04-02 LAB — URINALYSIS, ROUTINE W REFLEX MICROSCOPIC
Bacteria, UA: NONE SEEN
Bilirubin Urine: NEGATIVE
Glucose, UA: 150 mg/dL — AB
Ketones, ur: 20 mg/dL — AB
Leukocytes,Ua: NEGATIVE
Nitrite: NEGATIVE
Protein, ur: 100 mg/dL — AB
RBC / HPF: >50 RBC/hpf — ABNORMAL HIGH (ref 0–5)
Specific Gravity, Urine: 1.015 (ref 1.005–1.030)
pH: 5 (ref 5.0–8.0)

## 2022-04-02 LAB — CBC
HCT: 45.4 % (ref 39.0–52.0)
Hemoglobin: 15 g/dL (ref 13.0–17.0)
MCH: 29.3 pg (ref 26.0–34.0)
MCHC: 33 g/dL (ref 30.0–36.0)
MCV: 88.7 fL (ref 80.0–100.0)
Platelets: 291 10*3/uL (ref 150–400)
RBC: 5.12 MIL/uL (ref 4.22–5.81)
RDW: 14 % (ref 11.5–15.5)
WBC: 13.4 10*3/uL — ABNORMAL HIGH (ref 4.0–10.5)
nRBC: 0 % (ref 0.0–0.2)

## 2022-04-02 LAB — BASIC METABOLIC PANEL
Anion gap: 14 (ref 5–15)
BUN: 22 mg/dL — ABNORMAL HIGH (ref 6–20)
CO2: 22 mmol/L (ref 22–32)
Calcium: 9.7 mg/dL (ref 8.9–10.3)
Chloride: 103 mmol/L (ref 98–111)
Creatinine, Ser: 1.06 mg/dL (ref 0.61–1.24)
GFR, Estimated: >60 mL/min (ref >60)
Glucose, Bld: 251 mg/dL — ABNORMAL HIGH (ref 70–99)
Potassium: 4 mmol/L (ref 3.5–5.1)
Sodium: 139 mmol/L (ref 135–145)

## 2022-04-02 MED ORDER — KETOROLAC TROMETHAMINE 10 MG PO TABS: 10.0000 mg | ORAL_TABLET | Freq: Two times a day (BID) | ORAL | 0 refills | Status: AC

## 2022-04-02 MED ORDER — TAMSULOSIN HCL 0.4 MG PO CAPS: 0.4000 mg | ORAL_CAPSULE | Freq: Every day | ORAL | 0 refills | Status: AC

## 2022-04-02 MED ORDER — KETOROLAC TROMETHAMINE 15 MG/ML IJ SOLN
15.0000 mg | Freq: Once | INTRAMUSCULAR | Status: AC
  Administered 2022-04-02: 15 mg via INTRAMUSCULAR
  Filled 2022-04-02: qty 1

## 2022-04-02 MED ORDER — ONDANSETRON 4 MG PO TBDP
4.0000 mg | ORAL_TABLET | Freq: Once | ORAL | Status: AC
  Administered 2022-04-02: 4 mg via ORAL
  Filled 2022-04-02: qty 1

## 2022-04-02 NOTE — Discharge Instructions (Signed)
Your scan today did show evidence of a kidney stone.  This is likely the cause of your pain.  I have written you for a short course of anti-inflammatory medication to help with your symptoms.  Do not take additional ibuprofen or Aleve while taking this medication  Your blood sugar was mildly elevated while you were here.  I would recommend following up with your primary care provider to make sure this has improved  If your symptoms do not improve I recommend following up with urology.  If you do not have one that you follow-up with I have placed the contact information to one your discharge paperwork.  Return for new or worsening symptoms

## 2022-04-02 NOTE — ED Triage Notes (Signed)
Pt here from home for L flank pain that started at approx 1pm today. Pt reports pain started as intermittent, now constant, radiates around to LLQ. Pt reports hx of kidney stones, states this feels the same.

## 2022-04-02 NOTE — ED Notes (Signed)
This patient informed this EMT that he felt as if his kidney stone had passed. This EMT informed the patient to seek medical attention if his symptoms returned and or worsened.

## 2022-04-02 NOTE — ED Provider Notes (Signed)
[facility] EMERGENCY DEPARTMENT Provider Note   CSN: [id number] Arrival date & time: 04/02/22  1524    History  Chief Complaint  Patient presents with   Flank Pain    PAMELA MADDY is a 56 y.o. male hx of kidney stones, HTN, Hyperlipidemia here for evaluation of left flank pain.  Started approximately 1 PM today.  Sharp, stabbing.  Radiating to left lower quadrant.  No change in bowel movements.  Did note some hematuria and difficulty urinating.  Has history of kidney stones, states feels similar.  Had some nausea without vomiting.  No fever, cp, sob, numbness. No meds PTA. Does not want opiate medication of any sort per patient.  He is while he was in the bathroom earlier today he noted red/orange urine and thinks he might of passed his kidney stone  HPI     Home Medications Prior to Admission medications   Medication Sig Start Date End Date Taking? Authorizing Provider  ketorolac (TORADOL) 10 MG tablet Take 1 tablet (10 mg total) by mouth every 12 (twelve) hours for 2 days. 04/02/22 04/04/22 Yes Demetries Coia A, PA-C  tamsulosin (FLOMAX) 0.4 MG CAPS capsule Take 1 capsule (0.4 mg total) by mouth daily. 04/02/22  Yes Loretto Belinsky A, PA-C      Allergies    Lobster [shellfish allergy]    Review of Systems   Review of Systems  Constitutional: Negative.   HENT: Negative.    Respiratory: Negative.    Cardiovascular: Negative.   Gastrointestinal: Negative.   Genitourinary:  Positive for dysuria, flank pain and hematuria. Negative for difficulty urinating and frequency.  Skin: Negative.   Neurological: Negative.   All other systems reviewed and are negative.   Physical Exam Updated Vital Signs BP (!) 136/91 (BP Location: Right Arm)   Pulse 78   Temp 97.9 F (36.6 C)   Resp 18   SpO2 96%  Physical Exam Vitals and nursing note reviewed.  Constitutional:      General: He is not in acute distress.    Appearance: He is well-developed. He is not  ill-appearing, toxic-appearing or diaphoretic.  HENT:     Head: Normocephalic and atraumatic.     Nose: Nose normal.     Mouth/Throat:     Mouth: Mucous membranes are moist.  Eyes:     Pupils: Pupils are equal, round, and reactive to light.  Cardiovascular:     Rate and Rhythm: Normal rate and regular rhythm.     Pulses: Normal pulses.     Heart sounds: Normal heart sounds.  Pulmonary:     Effort: Pulmonary effort is normal. No respiratory distress.     Breath sounds: Normal breath sounds.  Abdominal:     General: Bowel sounds are normal. There is no distension.     Palpations: Abdomen is soft.     Tenderness: There is abdominal tenderness. There is no guarding or rebound.  Musculoskeletal:        General: No swelling, tenderness, deformity or signs of injury. Normal range of motion.     Cervical back: Normal range of motion and neck supple.  Skin:    General: Skin is warm and dry.  Neurological:     General: No focal deficit present.     Mental Status: He is alert and oriented to person, place, and time.     ED Results / Procedures / Treatments   Labs (all labs ordered are listed, but only abnormal results are displayed)  Labs Reviewed  CBC - Abnormal; Notable for the following components:      Result Value   WBC 13.4 (*)    All other components within normal limits  BASIC METABOLIC PANEL - Abnormal; Notable for the following components:   Glucose, Bld 251 (*)    BUN 22 (*)    All other components within normal limits  URINALYSIS, ROUTINE W REFLEX MICROSCOPIC - Abnormal; Notable for the following components:   Glucose, UA 150 (*)    Hgb urine dipstick LARGE (*)    Ketones, ur 20 (*)    Protein, ur 100 (*)    RBC / HPF >50 (*)    All other components within normal limits    EKG None  Radiology CT Renal Stone Study  Result Date: 04/02/2022 CLINICAL DATA:  Flank pain. EXAM: CT ABDOMEN AND PELVIS WITHOUT CONTRAST TECHNIQUE: Multidetector CT imaging of the abdomen  and pelvis was performed following the standard protocol without IV contrast. RADIATION DOSE REDUCTION: This exam was performed according to the departmental dose-optimization program which includes automated exposure control, adjustment of the mA and/or kV according to patient size and/or use of iterative reconstruction technique. COMPARISON:  None Available. FINDINGS: Lower chest: No acute abnormality. Hepatobiliary: No gallstones or biliary dilatation is noted. Hepatic steatosis. Pancreas: Unremarkable. No pancreatic ductal dilatation or surrounding inflammatory changes. Spleen: Normal in size without focal abnormality. Adrenals/Urinary Tract: Adrenal glands appear normal. Bilateral nephrolithiasis is noted. Moderate left hydronephrosis with perinephric stranding is noted secondary to 6 mm calculus in the midportion of the left ureter. Urinary bladder is unremarkable. Stomach/Bowel: Stomach is within normal limits. Appendix appears normal. No evidence of bowel wall thickening, distention, or inflammatory changes. Vascular/Lymphatic: Aortic atherosclerosis. No enlarged abdominal or pelvic lymph nodes. Reproductive: Prostate is unremarkable. Other: No abdominal wall hernia or abnormality. No abdominopelvic ascites. Musculoskeletal: No acute or significant osseous findings. IMPRESSION: Bilateral nephrolithiasis. Moderate left hydronephrosis with perinephric stranding is noted secondary to 6 mm calculus in the midportion of the left ureter. Hepatic steatosis. Aortic Atherosclerosis (ICD10-I70.0). Electronically Signed   By: Lupita Raider M.D.   On: 04/02/2022 16:31    Procedures Procedures    Medications Ordered in ED Medications  ketorolac (TORADOL) 15 MG/ML injection 15 mg (15 mg Intramuscular Given 04/02/22 1555)  ondansetron (ZOFRAN-ODT) disintegrating tablet 4 mg (4 mg Oral Given 04/02/22 1556)    ED Course/ Medical Decision Making/ A&P    56 year old here for evaluation of left flank pain which  feels consistent with his prior kidney stone.  He received Toradol in triage.  Symptoms improved after he went to the bathroom and felt like he possibly passed his kidney stone.  He is neurovascularly intact.  Benign ab exam  Labs and imaging personally viewed and interpreted:  UA with blood, no evidence of infection CBC leukocytosis 13.4 Metabolic panel mild hyperglycemia at 251, creatinine 1.06 CT stone study with left hydronephrosis with 6 mm calculus in the midportion of left ureter  Discussed results with patient.  He is adamant that he does not want any sort of opioid medication.  His pain is significantly improved.  He is tolerating p.o. intake.  Does not feel he needs any additional medication at this time.  I feel this is reasonable.  I encouraged p.o. hydration.  States he has antiemetics at home.  Not on Rx of opioid medications at home with him.  With regards to his mild hyperglycemia.,  Not on any steroids.  No document history of  DM.  He has no evidence of DKA, HHS.  We will have him follow-up with PCP for this  Encouraged close follow-up with urology if his symptoms do not improve, return for new or worsening symptoms.  The patient has been appropriately medically screened and/or stabilized in the ED. I have low suspicion for any other emergent medical condition which would require further screening, evaluation or treatment in the ED or require inpatient management.  Patient is hemodynamically stable and in no acute distress.  Patient able to ambulate in department prior to ED.  Evaluation does not show acute pathology that would require ongoing or additional emergent interventions while in the emergency department or further inpatient treatment.  I have discussed the diagnosis with the patient and answered all questions.  Pain is been managed while in the emergency department and patient has no further complaints prior to discharge.  Patient is comfortable with plan discussed in room  and is stable for discharge at this time.  I have discussed strict return precautions for returning to the emergency department.  Patient was encouraged to follow-up with PCP/specialist refer to at discharge.                           Medical Decision Making Amount and/or Complexity of Data Reviewed Independent Historian: spouse External Data Reviewed: labs, radiology and notes. Labs: ordered. Decision-making details documented in ED Course. Radiology: ordered and independent interpretation performed. Decision-making details documented in ED Course.  Risk OTC drugs. Prescription drug management. Diagnosis or treatment significantly limited by social determinants of health.           Final Clinical Impression(s) / ED Diagnoses Final diagnoses:  Kidney stone  Hyperglycemia    Rx / DC Orders ED Discharge Orders          Ordered    ketorolac (TORADOL) 10 MG tablet  Every 12 hours        04/02/22 2110    tamsulosin (FLOMAX) 0.4 MG CAPS capsule  Daily        04/02/22 2110              Kattia Selley A, PA-C 04/02/22 2111    Linwood Dibbles, MD 04/03/22 [phone]

## 2022-04-02 NOTE — ED Provider Triage Note (Signed)
Emergency Medicine Provider Triage Evaluation Note  Lawrence Sharp , a 56 y.o. male  was evaluated in triage.  Pt complains of left-sided flank pain since 1:00 this afternoon.  Patient reports history of kidney stones.  Patient reports that this feels a kidney stone.  Patient is endorsing nausea without vomiting, left-sided flank pain.  Patient denies any fevers, decreased urine stream, dysuria, urinary symptoms.  Patient states last kidney stone was 2 years ago.  Review of Systems  Positive:  Negative:   Physical Exam  BP (!) 187/102 (BP Location: Right Arm)   Pulse 66   Temp 99.8 F (37.7 C) (Oral)   Resp 20   SpO2 99%  Gen:   Awake, no distress   Resp:  Normal effort  MSK:   Moves extremities without difficulty  Other:  Left-sided CVA tenderness  Medical Decision Making  Medically screening exam initiated at 3:50 PM.  Appropriate orders placed.  Lawrence Sharp was informed that the remainder of the evaluation will be completed by another provider, this initial triage assessment does not replace that evaluation, and the importance of remaining in the ED until their evaluation is complete.     Lawrence Decant, PA-C 04/02/22 1552

## 2024-01-19 LAB — COLOGUARD: COLOGUARD: POSITIVE — AB
